# Patient Record
Sex: Male | Born: 1950 | Race: White | Hispanic: No | Smoking: Former smoker
Health system: Southern US, Community
[De-identification: ages and names within clinical notes are randomized; demographics above are authoritative.]

## PROBLEM LIST (undated history)

## (undated) DIAGNOSIS — K7460 Unspecified cirrhosis of liver: Secondary | ICD-10-CM

## (undated) DIAGNOSIS — M1990 Unspecified osteoarthritis, unspecified site: Secondary | ICD-10-CM

## (undated) DIAGNOSIS — B1920 Unspecified viral hepatitis C without hepatic coma: Secondary | ICD-10-CM

## (undated) HISTORY — PX: EYE SURGERY: SHX253

## (undated) HISTORY — DX: Unspecified viral hepatitis C without hepatic coma: B19.20

## (undated) HISTORY — PX: OTHER SURGICAL HISTORY: SHX169

## (undated) HISTORY — PX: TONSILLECTOMY: SUR1361

## (undated) HISTORY — DX: Unspecified osteoarthritis, unspecified site: M19.90

## (undated) HISTORY — DX: Unspecified cirrhosis of liver: K74.60

## (undated) HISTORY — PX: MANDIBLE FRACTURE SURGERY: SHX706

## (undated) HISTORY — PX: KNEE SURGERY: SHX244

---

## 2012-11-29 ENCOUNTER — Emergency Department (HOSPITAL_COMMUNITY)
Admission: EM | Admit: 2012-11-29 | Discharge: 2012-11-29 | Discharge status: Against Medical Advice | Payer: Self-pay | Attending: Emergency Medicine | Admitting: Emergency Medicine

## 2012-11-29 ENCOUNTER — Encounter (HOSPITAL_COMMUNITY): Payer: Self-pay | Admitting: *Deleted

## 2012-11-29 DIAGNOSIS — IMO0002 Reserved for concepts with insufficient information to code with codable children: Secondary | ICD-10-CM | POA: Insufficient documentation

## 2012-11-29 DIAGNOSIS — Y939 Activity, unspecified: Secondary | ICD-10-CM | POA: Insufficient documentation

## 2012-11-29 DIAGNOSIS — Y929 Unspecified place or not applicable: Secondary | ICD-10-CM | POA: Insufficient documentation

## 2012-11-29 DIAGNOSIS — X58XXXA Exposure to other specified factors, initial encounter: Secondary | ICD-10-CM | POA: Insufficient documentation

## 2012-11-29 DIAGNOSIS — Z87828 Personal history of other (healed) physical injury and trauma: Secondary | ICD-10-CM | POA: Insufficient documentation
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
## 2012-11-29 NOTE — ED Notes (Signed)
Pt arrives to [facility] in wheelchair. Pt has abrasion like injury to skin to LLE. Pt denies injury to area. Pt states it just started to look like that after he removed to bandage to his leg this morning. Pt states he put Hibiclens to his leg last night and covered it with a bandage.
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
## 2013-02-06 ENCOUNTER — Emergency Department (HOSPITAL_COMMUNITY)
Admission: EM | Admit: 2013-02-06 | Discharge: 2013-02-06 | Disposition: A | Discharge status: Home / Self Care | Payer: Medicaid Other | Attending: Emergency Medicine | Admitting: Emergency Medicine

## 2013-02-06 ENCOUNTER — Encounter (HOSPITAL_COMMUNITY): Payer: Self-pay

## 2013-02-06 DIAGNOSIS — Z79899 Other long term (current) drug therapy: Secondary | ICD-10-CM | POA: Insufficient documentation

## 2013-02-06 DIAGNOSIS — R21 Rash and other nonspecific skin eruption: Secondary | ICD-10-CM | POA: Insufficient documentation

## 2013-02-06 DIAGNOSIS — Z87891 Personal history of nicotine dependence: Secondary | ICD-10-CM | POA: Insufficient documentation

## 2013-02-06 MED ORDER — CEPHALEXIN 500 MG PO CAPS: 500.0000 mg | ORAL_CAPSULE | Freq: Four times a day (QID) | ORAL | Status: DC

## 2013-02-06 MED ORDER — SULFAMETHOXAZOLE-TRIMETHOPRIM 800-160 MG PO TABS: 1.0000 | ORAL_TABLET | Freq: Two times a day (BID) | ORAL | Status: DC

## 2013-02-06 NOTE — ED Notes (Signed)
Pt escorted to discharge window. Verbalized understanding discharge instructions. In no acute distress.   

## 2013-02-06 NOTE — ED Notes (Signed)
Pt c/o rash on lateral lower legs, chest and R hand.  Pain score 1/10.  Sts "it started on my legs and is now spreading."
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
## 2013-02-07 NOTE — ED Provider Notes (Signed)
Medical screening examination/treatment/procedure(s) were conducted as a shared visit with non-physician practitioner(s) and myself.  I personally evaluated the patient during the encounter  Patient here with rash. 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 No fevers, vomiting, other systemic symptoms. No gross erythema or cellulitis surrounding lesions, now here with weeping from on small area of redness and progression of rash. Rash with some small papules on anterior shins, no petechiae, no purpura, rash not concerning. Legs are hairless, shiny, concerning for possible PAD/PVD. This rash appears to be possible vasculitic rash. Without systemic symptoms, no need for workup at this time. Will place on antibiotics and encouraged PCP f/u.  Dagmar Hait, MD 02/07/13 [phone]

## 2013-02-15 ENCOUNTER — Ambulatory Visit: Payer: Self-pay | Attending: Internal Medicine | Admitting: Internal Medicine

## 2013-02-15 VITALS — BP 127/73 | HR 76 | Temp 98.0°F | Resp 16 | Ht 68.0 in | Wt 168.0 lb

## 2013-02-15 DIAGNOSIS — R21 Rash and other nonspecific skin eruption: Secondary | ICD-10-CM | POA: Insufficient documentation

## 2013-02-15 LAB — CBC WITH DIFFERENTIAL/PLATELET
Basophils Absolute: 0.1 10*3/uL (ref 0.0–0.1)
Eosinophils Relative: 4 % (ref 0–5)
Lymphocytes Relative: 24 % (ref 12–46)
Lymphs Abs: 1.2 10*3/uL (ref 0.7–4.0)
MCV: 89.8 fL (ref 78.0–100.0)
Neutro Abs: 3.2 10*3/uL (ref 1.7–7.7)
Platelets: 292 10*3/uL (ref 150–400)
RBC: 5.02 MIL/uL (ref 4.22–5.81)
RDW: 12.3 % (ref 11.5–15.5)
WBC: 5 10*3/uL (ref 4.0–10.5)

## 2013-02-15 MED ORDER — CEPHALEXIN 500 MG PO CAPS: 500.0000 mg | ORAL_CAPSULE | Freq: Four times a day (QID) | ORAL | Status: DC

## 2013-02-15 MED ORDER — TRIAMCINOLONE ACETONIDE 0.025 % EX OINT: TOPICAL_OINTMENT | Freq: Two times a day (BID) | CUTANEOUS | Status: DC

## 2013-02-15 MED ORDER — SULFAMETHOXAZOLE-TRIMETHOPRIM 800-160 MG PO TABS: 1.0000 | ORAL_TABLET | Freq: Two times a day (BID) | ORAL | Status: DC

## 2013-02-15 MED ORDER — TRIAMCINOLONE ACETONIDE 0.1 % EX CREA: TOPICAL_CREAM | Freq: Two times a day (BID) | CUTANEOUS | Status: DC
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
## 2013-02-15 NOTE — Progress Notes (Signed)
Pt is here to establish care. Pt reports having rashes on both legs below the knee. He noticed the rash in July. Now the rash is spreading up his right leg and face.

## 2013-02-16 LAB — BASIC METABOLIC PANEL
CO2: 28 mEq/L (ref 19–32)
Chloride: 101 mEq/L (ref 96–112)
Creat: 0.95 mg/dL (ref 0.50–1.35)
Potassium: 4.2 mEq/L (ref 3.5–5.3)
Sodium: 138 mEq/L (ref 135–145)

## 2013-03-10 ENCOUNTER — Ambulatory Visit: Payer: Medicaid Other | Attending: Internal Medicine | Admitting: Internal Medicine

## 2013-03-10 VITALS — BP 119/70 | HR 67 | Temp 98.1°F | Resp 16 | Ht 67.0 in | Wt 171.0 lb

## 2013-03-10 DIAGNOSIS — R21 Rash and other nonspecific skin eruption: Secondary | ICD-10-CM

## 2013-03-10 MED ORDER — TRIAMCINOLONE ACETONIDE 0.025 % EX OINT: TOPICAL_OINTMENT | Freq: Two times a day (BID) | CUTANEOUS | Status: DC

## 2013-03-10 MED ORDER — TERBINAFINE HCL 250 MG PO TABS: 250.0000 mg | ORAL_TABLET | Freq: Every day | ORAL | Status: DC

## 2013-03-10 MED ORDER — DOXYCYCLINE HYCLATE 100 MG PO TABS: 100.0000 mg | ORAL_TABLET | Freq: Two times a day (BID) | ORAL | Status: DC

## 2013-03-10 NOTE — Progress Notes (Unsigned)
Patient ID: Theodore Carlson, male   DOB: 06-Oct-1950, 62 y.o.   MRN: [id number] Patient Demographics  Theodore Carlson, is a 62 y.o. male  HQI:[id number]  XLK:[id number]  DOB - May 23, 1950  Chief Complaint  Patient presents with  . Follow-up        Subjective:   Theodore Carlson today is here for a follow up visit.  The patient is a 62 year old male with nonspecific rash on his legs, has been waiting for dermatology referral. He states that he was placed on Keflex and Bactrim and Kenalog cream, helped in the beginning but he was on the medication. Patient has No headache, No chest pain, No abdominal pain - No Nausea, No new weakness tingling or numbness, No Cough - SOB.   Objective:    Filed Vitals:   03/10/13 1148  BP: 119/70  Pulse: 67  Temp: 98.1 F (36.7 C)  TempSrc: Oral  Resp: 16  Height: 5\' 7"  (1.702 m)  Weight: 171 lb (77.565 kg)  SpO2: 97%     ALLERGIES:   Allergies  Allergen Reactions  . Adhesive [Tape] Rash    PAST MEDICAL HISTORY: History reviewed. No pertinent past medical history.  MEDICATIONS AT HOME: Prior to Admission medications   Medication Sig Start Date End Date Taking? Authorizing Provider  B Complex-C (B-COMPLEX WITH VITAMIN C) tablet Take 1 tablet by mouth daily.   Yes Historical Provider, MD  triamcinolone (KENALOG) 0.025 % ointment Apply topically 2 (two) times daily. 03/10/13  Yes Ripudeep Jenna Luo, MD  doxycycline (VIBRA-TABS) 100 MG tablet Take 1 tablet (100 mg total) by mouth 2 (two) times daily. 03/10/13   Ripudeep Jenna Luo, MD  terbinafine (LAMISIL) 250 MG tablet Take 1 tablet (250 mg total) by mouth daily. 03/10/13   Ripudeep Jenna Luo, MD     Exam  General appearance :Awake, alert, NAD, Speech Clear.  HEENT: Atraumatic and Normocephalic, PERLA Neck: supple, no JVD. No cervical lymphadenopathy.  Chest: Clear to auscultation bilaterally, no wheezing, rales or rhonchi CVS: S1 S2 regular, no murmurs.  Abdomen: soft, NBS, NT, ND, no  gaurding, rigidity or rebound. Extremities: no cyanosis or clubbing, B/L Lower Ext shows no edema Neurology: Awake alert, and oriented X 3, CN II-XII intact, Non focal Skin: Rash, non-purpuric, nonpalpable on bilateral legs, nontender Wounds:N/A    Data Review   Basic Metabolic Panel: No results found for this basename: NA, K, CL, CO2, GLUCOSE, BUN, CREATININE, CALCIUM, MG, PHOS,  in the last 168 hours Liver Function Tests: No results found for this basename: AST, ALT, ALKPHOS, BILITOT, PROT, ALBUMIN,  in the last 168 hours  CBC: No results found for this basename: WBC, NEUTROABS, HGB, HCT, MCV, PLT,  in the last 168 hours  ------------------------------------------------------------------------------------------------------------------ No results found for this basename: HGBA1C,  in the last 72 hours ------------------------------------------------------------------------------------------------------------------ No results found for this basename: CHOL, HDL, LDLCALC, TRIG, CHOLHDL, LDLDIRECT,  in the last 72 hours ------------------------------------------------------------------------------------------------------------------ No results found for this basename: TSH, T4TOTAL, FREET3, T3FREE, THYROIDAB,  in the last 72 hours ------------------------------------------------------------------------------------------------------------------ No results found for this basename: VITAMINB12, FOLATE, FERRITIN, TIBC, IRON, RETICCTPCT,  in the last 72 hours  Coagulation profile  No results found for this basename: INR, PROTIME,  in the last 168 hours    Assessment & Plan   Active Problems: Rash: Since July 2014, nonspecific, patient states that Keflex and Septra initially helped, now has fungal infection in the right second toe - Placed him on doxycycline 100 mg twice a day for 2  weeks, continue kenolog - Lamisil 250 mg daily for the foot - Ambulatory referral to dermatology sent,  urgent  Follow-up in 3 months     RAI,RIPUDEEP M.D. 03/10/2013, 12:27 PM

## 2013-03-10 NOTE — Progress Notes (Unsigned)
Pt is here to follow up on the rashes on his legs. He said that at fist the rashes started to go away with the medication but now the rashes have came back and they are painful and is causing him to be very uncomfortable.

## 2013-03-18 ENCOUNTER — Other Ambulatory Visit: Payer: Self-pay

## 2013-05-12 ENCOUNTER — Encounter: Payer: Self-pay | Admitting: Internal Medicine

## 2013-05-12 ENCOUNTER — Other Ambulatory Visit: Payer: Self-pay | Admitting: *Deleted

## 2013-05-12 ENCOUNTER — Ambulatory Visit: Payer: Self-pay | Attending: Internal Medicine | Admitting: Internal Medicine

## 2013-05-12 VITALS — BP 146/82 | HR 81 | Temp 98.7°F | Resp 14 | Ht 68.0 in | Wt 173.2 lb

## 2013-05-12 DIAGNOSIS — R21 Rash and other nonspecific skin eruption: Secondary | ICD-10-CM | POA: Insufficient documentation

## 2013-05-12 MED ORDER — CLOBETASOL PROPIONATE 0.05 % EX CREA: 1.0000 "application " | TOPICAL_CREAM | Freq: Two times a day (BID) | CUTANEOUS | Status: DC

## 2013-05-12 MED ORDER — CLOBETASOL PROPIONATE 0.05 % EX SOLN: 1.0000 "application " | Freq: Two times a day (BID) | CUTANEOUS | Status: DC

## 2013-05-12 NOTE — Progress Notes (Signed)
Pt is here for a f/u of a rash. Rash is on bilateral lower legs; itching, redness, spreading to bottom pip and Lt jaw line, knot on bottom lip. No OTC meds. meds prescribed did not work.
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
## 2013-05-12 NOTE — Addendum Note (Signed)
Addended by: Susie Cassette MD, Germain Osgood on: 05/12/2013 12:23 PM   Modules accepted: Orders, Medications

## 2013-08-10 ENCOUNTER — Telehealth: Payer: Self-pay | Admitting: Internal Medicine

## 2013-08-10 NOTE — Telephone Encounter (Signed)
Pt has come in today requesting another referral to dermatology that the [facility] covers; pt has been paying Out of pocket and is no longer able to afford the $85 co-pay; please f/u with pt about other available resources; [phone]

## 2013-09-10 NOTE — Telephone Encounter (Signed)
Arna Mediciora addressed this pt's referral

## 2013-11-08 ENCOUNTER — Ambulatory Visit: Payer: Medicaid Other | Attending: Internal Medicine | Admitting: Internal Medicine

## 2013-11-08 ENCOUNTER — Encounter: Payer: Self-pay | Admitting: Internal Medicine

## 2013-11-08 VITALS — BP 133/83 | HR 65 | Temp 97.6°F | Resp 16 | Ht 68.0 in | Wt 178.0 lb

## 2013-11-08 DIAGNOSIS — M79609 Pain in unspecified limb: Secondary | ICD-10-CM | POA: Diagnosis not present

## 2013-11-08 DIAGNOSIS — M79605 Pain in left leg: Secondary | ICD-10-CM

## 2013-11-08 DIAGNOSIS — R21 Rash and other nonspecific skin eruption: Secondary | ICD-10-CM | POA: Diagnosis not present

## 2013-11-08 DIAGNOSIS — Z79899 Other long term (current) drug therapy: Secondary | ICD-10-CM | POA: Diagnosis not present

## 2013-11-08 DIAGNOSIS — I10 Essential (primary) hypertension: Secondary | ICD-10-CM | POA: Insufficient documentation

## 2013-11-08 LAB — LIPID PANEL
Cholesterol: 144 mg/dL (ref 0–200)
HDL: 31 mg/dL — ABNORMAL LOW (ref >39)
LDL CALC: 67 mg/dL (ref 0–99)
TRIGLYCERIDES: 231 mg/dL — AB (ref <150)
Total CHOL/HDL Ratio: 4.6 Ratio
VLDL: 46 mg/dL — ABNORMAL HIGH (ref 0–40)

## 2013-11-08 LAB — COMPLETE METABOLIC PANEL WITH GFR
ALK PHOS: 61 U/L (ref 39–117)
ALT: 48 U/L (ref 0–53)
AST: 52 U/L — ABNORMAL HIGH (ref 0–37)
Albumin: 4.1 g/dL (ref 3.5–5.2)
BILIRUBIN TOTAL: 0.7 mg/dL (ref 0.2–1.2)
BUN: 8 mg/dL (ref 6–23)
CHLORIDE: 103 meq/L (ref 96–112)
CO2: 27 mEq/L (ref 19–32)
Calcium: 9.2 mg/dL (ref 8.4–10.5)
Creat: 0.63 mg/dL (ref 0.50–1.35)
Glucose, Bld: 90 mg/dL (ref 70–99)
POTASSIUM: 4 meq/L (ref 3.5–5.3)
Sodium: 138 mEq/L (ref 135–145)
TOTAL PROTEIN: 6.8 g/dL (ref 6.0–8.3)

## 2013-11-08 LAB — CBC WITH DIFFERENTIAL/PLATELET
BASOS ABS: 0.1 10*3/uL (ref 0.0–0.1)
Basophils Relative: 2 % — ABNORMAL HIGH (ref 0–1)
Eosinophils Absolute: 0.1 10*3/uL (ref 0.0–0.7)
Eosinophils Relative: 3 % (ref 0–5)
HCT: 44.1 % (ref 39.0–52.0)
Hemoglobin: 15.8 g/dL (ref 13.0–17.0)
LYMPHS PCT: 26 % (ref 12–46)
Lymphs Abs: 1.2 10*3/uL (ref 0.7–4.0)
MCH: 31.5 pg (ref 26.0–34.0)
MCHC: 35.8 g/dL (ref 30.0–36.0)
MCV: 87.8 fL (ref 78.0–100.0)
Monocytes Absolute: 0.5 10*3/uL (ref 0.1–1.0)
Monocytes Relative: 11 % (ref 3–12)
NEUTROS ABS: 2.8 10*3/uL (ref 1.7–7.7)
Neutrophils Relative %: 58 % (ref 43–77)
PLATELETS: 245 10*3/uL (ref 150–400)
RBC: 5.02 MIL/uL (ref 4.22–5.81)
RDW: 12.6 % (ref 11.5–15.5)
WBC: 4.8 10*3/uL (ref 4.0–10.5)

## 2013-11-08 LAB — POCT GLYCOSYLATED HEMOGLOBIN (HGB A1C): HEMOGLOBIN A1C: 5

## 2013-11-08 MED ORDER — MELOXICAM 15 MG PO TABS: 15.0000 mg | ORAL_TABLET | Freq: Every day | ORAL | Status: DC

## 2013-11-08 NOTE — Progress Notes (Signed)
Pt is here following up on his b/l knee and ankle pain. Pt has some lesions on his legs that causes pain and a burning sensation.

## 2013-11-08 NOTE — Progress Notes (Signed)
Patient ID: Excell SeltzerRobert Towner, male   DOB: Aug 27, 1950, 63 y.o.   MRN: [id number]   Excell SeltzerRobert Capaldi, is a 63 y.o. male  [id number]  [id number]  DOB - Aug 27, 1950  Chief Complaint  Patient presents with  . Follow-up        Subjective:   Excell SeltzerRobert Jagiello is a 63 y.o. male here today for a follow up visit. Pt is here to follow up on the rashes on his legs. He said that at first the rashes started to go away with the medication but now the rashes have came back and they are painful and is causing him to be very uncomfortable. He has been on several antibiotics including doxycycline, Kenalog cream, Keflex, Lamisil, and multiple vitamins but no sustained relief. The rashes start as maculopapular rash, itchy, blister and scab formation and healing with a scar. When they blister and ulcerate they become painful. He had gone to the dermatologist we will did not recommend any medication according to patient because of the time he went there was no active lesion to treat. Patient does not smoke cigarette he does not drink alcohol. Patient has No headache, No chest pain, No abdominal pain - No Nausea, No new weakness tingling or numbness, No Cough - SOB.  Problem  Left Leg Pain    ALLERGIES: Allergies  Allergen Reactions  . Adhesive [Tape] Rash    PAST MEDICAL HISTORY: History reviewed. No pertinent past medical history.  MEDICATIONS AT HOME: Prior to Admission medications   Medication Sig Start Date End Date Taking? Authorizing Provider  B Complex-C (B-COMPLEX WITH VITAMIN C) tablet Take 1 tablet by mouth daily.   Yes Historical Provider, MD  clobetasol (TEMOVATE) 0.05 % external solution Apply 1 application topically 2 (two) times daily. 05/12/13   Richarda OverlieNayana Abrol, MD  doxycycline (VIBRA-TABS) 100 MG tablet Take 1 tablet (100 mg total) by mouth 2 (two) times daily. 03/10/13   Ripudeep Jenna LuoK Rai, MD  meloxicam (MOBIC) 15 MG tablet Take 1 tablet (15 mg total) by mouth daily. 11/08/13   Jeanann Lewandowskylugbemiga  Ajia Chadderdon, MD  terbinafine (LAMISIL) 250 MG tablet Take 1 tablet (250 mg total) by mouth daily. 03/10/13   Ripudeep Jenna LuoK Rai, MD  triamcinolone (KENALOG) 0.025 % ointment Apply topically 2 (two) times daily. 03/10/13   Ripudeep Jenna LuoK Rai, MD     Objective:   Filed Vitals:   11/08/13 1140  BP: 133/83  Pulse: 65  Temp: 97.6 F (36.4 C)  TempSrc: Oral  Resp: 16  Height: 5\' 8"  (1.727 m)  Weight: 178 lb (80.74 kg)  SpO2: 96%    Exam General appearance : Awake, alert, not in any distress. Speech Clear. Not toxic looking HEENT: Atraumatic and Normocephalic, pupils equally reactive to light and accomodation Neck: supple, no JVD. No cervical lymphadenopathy.  Chest:Good air entry bilaterally, no added sounds  CVS: S1 S2 regular, no murmurs.  Abdomen: Bowel sounds present, Non tender and not distended with no gaurding, rigidity or rebound. Extremities: B/L Lower Ext shows no edema, both legs are warm to touch Neurology: Awake alert, and oriented X 3, CN II-XII intact, Non focal Skin: Multiple healed scars in both lower limbs with fresh ulcers  Data Review No results found for this basename: HGBA1C     Assessment & Plan   1. Left leg pain with multiple healed scars and new rashes, I suspect some venous or vascular insufficiency  - Lower Extremity Arterial Doppler Bilateral; Future - meloxicam (MOBIC) 15 MG tablet; Take 1 tablet (15  mg total) by mouth daily.  Dispense: 30 tablet; Refill: 0 - CBC with Differential - COMPLETE METABOLIC PANEL WITH GFR - POCT glycosylated hemoglobin (Hb A1C) - Lipid panel - TSH   Patient was counseled extensively about nutrition and exercise   Return in about 6 months (around 05/10/2014) for Follow up HTN, Follow up Pain and comorbidities.  The patient was given clear instructions to go to ER or return to medical center if symptoms don't improve, worsen or new problems develop. The patient verbalized understanding. The patient was told to call to get lab  results if they haven't heard anything in the next week.   This note has been created with Education officer, environmentalDragon speech recognition software and smart phrase technology. Any transcriptional errors are unintentional.    Jeanann LewandowskyJEGEDE, Zelma Mazariego, MD, MHA, FACP, FAAP [facility] [place], [place] [phone]   11/08/2013, 12:15 PM

## 2013-11-09 LAB — TSH: TSH: 1.653 u[IU]/mL (ref 0.350–4.500)

## 2013-11-10 ENCOUNTER — Telehealth: Payer: Self-pay | Admitting: *Deleted

## 2013-11-10 NOTE — Telephone Encounter (Signed)
Message copied by Raynelle CharyWINFREE, Shyenne Maggard R on Wed Nov 10, 2013 11:04 AM ------      Message from: Quentin AngstJEGEDE, OLUGBEMIGA E      Created: Tue Nov 09, 2013  9:01 AM       Please inform patient that his laboratory tests results are mostly within normal limit but his triglyceride (a form of cholesterol) is slightly high. We recommend regular physical exercise, low-cholesterol and low-fat diet. ------

## 2013-11-10 NOTE — Telephone Encounter (Signed)
Pt is aware of his lab results. 

## 2013-11-18 ENCOUNTER — Ambulatory Visit (HOSPITAL_COMMUNITY)
Admission: RE | Admit: 2013-11-18 | Discharge: 2013-11-18 | Disposition: A | Discharge status: Home / Self Care | Payer: Medicaid Other | Source: Ambulatory Visit | Attending: Internal Medicine | Admitting: Internal Medicine

## 2013-11-18 DIAGNOSIS — R21 Rash and other nonspecific skin eruption: Secondary | ICD-10-CM | POA: Diagnosis not present

## 2013-11-18 DIAGNOSIS — M79609 Pain in unspecified limb: Secondary | ICD-10-CM | POA: Diagnosis not present

## 2013-11-18 DIAGNOSIS — M79605 Pain in left leg: Secondary | ICD-10-CM

## 2013-11-18 NOTE — Progress Notes (Signed)
VASCULAR LAB PRELIMINARY  ARTERIAL  ABI completed:    RIGHT    LEFT    PRESSURE WAVEFORM  PRESSURE WAVEFORM  BRACHIAL 122 Tri BRACHIAL 122 Tri  DP   DP    AT 143 Tri AT 145 Tri  PT 145 Tri PT 122 Tri  PER   PER    GREAT TOE  NA GREAT TOE  NA    RIGHT LEFT  ABI 1.19 1.19     Farrel DemarkJill Eunice, RDMS, RVT  11/18/2013, 9:42 AM

## 2013-11-22 NOTE — Progress Notes (Signed)
Pt was in the office on Friday where I told him about the doppler results

## 2013-12-17 ENCOUNTER — Telehealth: Payer: Self-pay | Admitting: Internal Medicine

## 2013-12-17 NOTE — Telephone Encounter (Signed)
Pt needs pain medication refill Please f/u with Pt

## 2013-12-24 NOTE — Telephone Encounter (Signed)
Pt received his pain meds.

## 2013-12-29 ENCOUNTER — Emergency Department (HOSPITAL_COMMUNITY)
Admission: EM | Admit: 2013-12-29 | Discharge: 2013-12-29 | Disposition: A | Discharge status: Home / Self Care | Payer: Medicaid Other | Source: Home / Self Care | Attending: Emergency Medicine | Admitting: Emergency Medicine

## 2013-12-29 ENCOUNTER — Encounter (HOSPITAL_COMMUNITY): Payer: Self-pay | Admitting: Emergency Medicine

## 2013-12-29 DIAGNOSIS — R21 Rash and other nonspecific skin eruption: Secondary | ICD-10-CM

## 2013-12-29 LAB — RPR

## 2013-12-29 MED ORDER — PREDNISONE 20 MG PO TABS: ORAL_TABLET | ORAL | Status: DC

## 2013-12-29 MED ORDER — METHYLPREDNISOLONE ACETATE 40 MG/ML IJ SUSP
80.0000 mg | Freq: Once | INTRAMUSCULAR | Status: AC
  Administered 2013-12-29: 80 mg via INTRAMUSCULAR

## 2013-12-29 MED ORDER — METHYLPREDNISOLONE ACETATE 80 MG/ML IJ SUSP
INTRAMUSCULAR | Status: AC
  Filled 2013-12-29: qty 1

## 2013-12-29 MED ORDER — HYDROCORTISONE 1 % EX CREA: TOPICAL_CREAM | CUTANEOUS | Status: DC

## 2013-12-29 NOTE — ED Notes (Signed)
Patient c/o rash on both legs x 1 year the rash is spreading to his groin area and is very painful. Patient reports he is already being seen and treated for rash on his legs. Patient denies SOB. Patient is alert and oriented and in no acute distress.

## 2013-12-29 NOTE — Discharge Instructions (Signed)
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
## 2013-12-29 NOTE — ED Provider Notes (Signed)
Medical screening examination/treatment/procedure(s) were performed by non-physician practitioner and as supervising physician I was immediately available for consultation/collaboration.  Shakea Isip, M.D.  Lexton Hidalgo C Mesha Schamberger, MD 12/29/13 2233 

## 2013-12-31 LAB — HERPES SIMPLEX VIRUS CULTURE: CULTURE: NOT DETECTED

## 2014-01-10 ENCOUNTER — Telehealth: Payer: Self-pay | Admitting: Internal Medicine

## 2014-01-10 NOTE — ED Notes (Addendum)
C/o rash is worse in spite of compliance w treatment. Has visited dermatologist , and has been given rx cram for this, and has continued to worsen. Rash for 1 year. Vilinda Boehringer, PA, had telephone conversation w patient regarding his test results, and a plan for his care

## 2014-01-10 NOTE — ED Provider Notes (Signed)
Medical screening examination/treatment/procedure(s) were performed by non-physician practitioner and as supervising physician I was immediately available for consultation/collaboration.  Leslee Home, M.D.  Reuben Likes, MD 01/10/14 1440

## 2014-01-10 NOTE — Telephone Encounter (Signed)
Patient has come in today to see if he can get a call back about the results of his lab work from Urgent care done on 12/29/13; please f/u with patient about his results

## 2014-01-11 NOTE — Telephone Encounter (Signed)
Left message informing pt that test were neg. If he had questions then to call me back.

## 2014-01-14 ENCOUNTER — Ambulatory Visit: Payer: Medicaid Other | Attending: Family Medicine | Admitting: Family Medicine

## 2014-01-14 ENCOUNTER — Telehealth: Payer: Self-pay | Admitting: Internal Medicine

## 2014-01-14 VITALS — BP 113/70 | HR 87 | Temp 98.1°F | Resp 20 | Wt 169.0 lb

## 2014-01-14 DIAGNOSIS — R21 Rash and other nonspecific skin eruption: Secondary | ICD-10-CM

## 2014-01-14 MED ORDER — TERBINAFINE HCL 250 MG PO TABS: 250.0000 mg | ORAL_TABLET | Freq: Every day | ORAL | Status: DC

## 2014-01-14 NOTE — Telephone Encounter (Signed)
Patient has called in today to see if there are any available appointments; patient is experiencing a spreading of a rash into his genital area; patient is also requesting a medication refill for predniSONE (DELTASONE) 20 MG tablet; Please f/u with patient ASAP about what actions he should take

## 2014-01-14 NOTE — Patient Instructions (Addendum)
Mr. Theodore Carlson,  I am concerned about tinea cruis (jock itch) and would like to treat you.  Stop lidex lamisil 250 mg daily for 4 weeks F/u with derm and PCP   Dr. Armen Pickup

## 2014-01-14 NOTE — Progress Notes (Signed)
   Subjective:    Patient ID: Theodore Carlson, male    DOB: 14-May-1950, 63 y.o.   MRN: [id number] CC: walk-in, worsening R groin rash   HPI Rash R groin rash since 12/20/13, 12/27/13 patient went to ED, rash improved with oral prednisone, patient went to dermatology in [facility] (Dr. Lianne Moris) who performed skin cultures and prescribed lidex cream. Patient using cream BID, rash is now worsening, burning sensation, severe. Patient washing with dial and cold water 2-3 times daily.   Has regular night sweats.  Denies fever, chills.    Soc Hx: former smoker  Review of Systems As per HPI     Objective:   Physical Exam BP 113/70  Pulse 87  Temp(Src) 98.1 F (36.7 C)  Resp 20  Wt 169 lb (76.658 kg)  SpO2 96% Wt Readings from Last 3 Encounters:  01/14/14 169 lb (76.658 kg)  11/08/13 178 lb (80.74 kg)  05/12/13 173 lb 3.2 oz (78.563 kg)  General appearance: alert, cooperative and no distress Skin: R groin (thigh and R scrotum) erythematous rash, dry, flaky, darker red and slightly raised border.  No inguinal adenopathy. No penile discharge. No scrotal pain or tenderness        Assessment & Plan:

## 2014-01-14 NOTE — Assessment & Plan Note (Addendum)
A: rash in groin area. Partially responsive to steroids. Exam concerning for tinea cruris   P:  Stop lidex lamisil 250 mg daily for 4 weeks Patient to sign release of information form so we can receive records  F/u with derm and PCP

## 2014-03-24 ENCOUNTER — Telehealth: Payer: Self-pay | Admitting: Internal Medicine

## 2014-03-24 NOTE — Telephone Encounter (Signed)
Pt. Came into facility stating that the medication fluocinonide cream (LIDEX) 0.05 % is not working for him, pt states that he is having discomfort, and pt has no sensation. Please f/u with pt.

## 2014-03-25 NOTE — Telephone Encounter (Signed)
Expand All Collapse All   Pt. Came into facility stating that the medication fluocinonide cream (LIDEX) 0.05 % is not working for him, pt states that he is having discomfort, and pt has no sensation. Please f/u with pt.

## 2014-03-25 NOTE — Telephone Encounter (Signed)
Patient informed by front desk that he needs PCP f/u.

## 2014-05-09 ENCOUNTER — Ambulatory Visit: Payer: Medicaid Other | Admitting: Internal Medicine

## 2014-09-07 ENCOUNTER — Other Ambulatory Visit (HOSPITAL_COMMUNITY): Payer: Self-pay | Admitting: Nurse Practitioner

## 2014-09-07 DIAGNOSIS — B182 Chronic viral hepatitis C: Secondary | ICD-10-CM

## 2014-09-28 ENCOUNTER — Telehealth (HOSPITAL_COMMUNITY): Payer: Self-pay

## 2014-09-28 NOTE — Telephone Encounter (Signed)
Called pt to remind him of 1100am appt on 09/29/14... Pt agreed to not have anything to eat or drink 6hrs prior to exam. AW

## 2014-09-29 ENCOUNTER — Ambulatory Visit (HOSPITAL_COMMUNITY)
Admission: RE | Admit: 2014-09-29 | Discharge: 2014-09-29 | Disposition: A | Discharge status: Home / Self Care | Payer: Medicare Other | Source: Ambulatory Visit | Attending: Nurse Practitioner | Admitting: Nurse Practitioner

## 2014-09-29 DIAGNOSIS — K7460 Unspecified cirrhosis of liver: Secondary | ICD-10-CM | POA: Diagnosis not present

## 2014-09-29 DIAGNOSIS — B182 Chronic viral hepatitis C: Secondary | ICD-10-CM

## 2014-11-15 ENCOUNTER — Encounter: Payer: Self-pay | Admitting: Physician Assistant

## 2014-11-29 ENCOUNTER — Ambulatory Visit (INDEPENDENT_AMBULATORY_CARE_PROVIDER_SITE_OTHER): Payer: Medicare Other | Admitting: Physician Assistant

## 2014-11-29 ENCOUNTER — Other Ambulatory Visit (INDEPENDENT_AMBULATORY_CARE_PROVIDER_SITE_OTHER): Payer: Medicare Other

## 2014-11-29 ENCOUNTER — Encounter: Payer: Self-pay | Admitting: Physician Assistant

## 2014-11-29 VITALS — BP 122/64 | Ht 68.0 in | Wt 176.4 lb

## 2014-11-29 DIAGNOSIS — B1920 Unspecified viral hepatitis C without hepatic coma: Secondary | ICD-10-CM

## 2014-11-29 DIAGNOSIS — K7460 Unspecified cirrhosis of liver: Secondary | ICD-10-CM

## 2014-11-29 LAB — PROTIME-INR
INR: 1.3 ratio — ABNORMAL HIGH (ref 0.8–1.0)
PROTHROMBIN TIME: 14.6 s — AB (ref 9.6–13.1)

## 2014-11-29 LAB — CBC WITH DIFFERENTIAL/PLATELET
BASOS ABS: 0 10*3/uL (ref 0.0–0.1)
Basophils Relative: 0.6 % (ref 0.0–3.0)
EOS PCT: 3.6 % (ref 0.0–5.0)
Eosinophils Absolute: 0.2 10*3/uL (ref 0.0–0.7)
HCT: 44 % (ref 39.0–52.0)
Hemoglobin: 15.1 g/dL (ref 13.0–17.0)
LYMPHS ABS: 1.5 10*3/uL (ref 0.7–4.0)
Lymphocytes Relative: 25 % (ref 12.0–46.0)
MCHC: 34.4 g/dL (ref 30.0–36.0)
MCV: 91 fl (ref 78.0–100.0)
Monocytes Absolute: 0.7 10*3/uL (ref 0.1–1.0)
Monocytes Relative: 11.7 % (ref 3.0–12.0)
NEUTROS ABS: 3.6 10*3/uL (ref 1.4–7.7)
NEUTROS PCT: 59.1 % (ref 43.0–77.0)
PLATELETS: 281 10*3/uL (ref 150.0–400.0)
RBC: 4.84 Mil/uL (ref 4.22–5.81)
RDW: 12.6 % (ref 11.5–15.5)
WBC: 6 10*3/uL (ref 4.0–10.5)
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
## 2014-11-29 NOTE — Patient Instructions (Signed)
You have been scheduled for an endoscopy. Please follow written instructions given to you at your visit today. If you use inhalers (even only as needed), please bring them with you on the day of your procedure.  Your physician has requested that you go to the basement for lab work before leaving today.  
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
## 2014-11-30 ENCOUNTER — Telehealth: Payer: Self-pay | Admitting: Internal Medicine

## 2014-11-30 NOTE — Telephone Encounter (Signed)
Patient is [facility] Left message for patient to call back

## 2014-11-30 NOTE — Telephone Encounter (Signed)
Patient notified he is to arrive at 8:00 am on 12/20/14 and be NPO after 5:30 am and may have clear liquids until then.

## 2014-12-05 NOTE — Progress Notes (Signed)
Agree w/ Ms. Hvozdovic's note and mangement.  

## 2014-12-14 ENCOUNTER — Telehealth: Payer: Self-pay | Admitting: Nurse Practitioner

## 2014-12-14 ENCOUNTER — Encounter (HOSPITAL_COMMUNITY): Payer: Self-pay | Admitting: *Deleted

## 2014-12-14 NOTE — Progress Notes (Signed)
12-14-14 1415 Pt states using Taxi for transportation alone- no one to accompany. Note per Epic to Dr. Marvell Fuller office.

## 2014-12-14 NOTE — Telephone Encounter (Signed)
Ms. Maryan Char (RN from [facility]) called from Pre-surgical testing department in regards to obtaining medical records for this patient. They are requesting the LOV notes, any studies or progress notes to indicate history of pulmonary arterial Hypertension or reason for "Revatio" use. If you are able to release these medical records without the auth of release form signed by the patient, please fax to [phone]. Thank you Arna Medici.

## 2014-12-15 NOTE — Telephone Encounter (Signed)
Theodore Carlson I can't do it without patient authorization

## 2014-12-19 NOTE — Anesthesia Preprocedure Evaluation (Addendum)
Anesthesia Evaluation  Patient identified by MRN, date of birth, ID band Patient awake    Reviewed: Allergy & Precautions, NPO status , Patient's Chart, lab work & pertinent test results  History of Anesthesia Complications Negative for: history of anesthetic complications  Airway Mallampati: II  TM Distance: >3 FB Neck ROM: Full    Dental no notable dental hx. (+) Dental Advisory Given   Pulmonary former smoker,  breath sounds clear to auscultation  Pulmonary exam normal       Cardiovascular negative cardio ROS Normal cardiovascular examRhythm:Regular Rate:Normal  Does not have pulmonary HTN, uses sildenifil for erectile dysfunction   Neuro/Psych negative neurological ROS  negative psych ROS   GI/Hepatic negative GI ROS, (+) Hepatitis -, C  Endo/Other  negative endocrine ROS  Renal/GU negative Renal ROS  negative genitourinary   Musculoskeletal  (+) Arthritis -,   Abdominal   Peds negative pediatric ROS (+)  Hematology negative hematology ROS (+)   Anesthesia Other Findings   Reproductive/Obstetrics negative OB ROS                            Anesthesia Physical Anesthesia Plan  ASA: III  Anesthesia Plan: MAC   Post-op Pain Management:    Induction: Intravenous  Airway Management Planned: Nasal Cannula  Additional Equipment:   Intra-op Plan:   Post-operative Plan:   Informed Consent: I have reviewed the patients History and Physical, chart, labs and discussed the procedure including the risks, benefits and alternatives for the proposed anesthesia with the patient or authorized representative who has indicated his/her understanding and acceptance.   Dental advisory given  Plan Discussed with: CRNA  Anesthesia Plan Comments:         Anesthesia Quick Evaluation

## 2014-12-19 NOTE — Progress Notes (Addendum)
Several request were made to the [facility] and [facility] medical records for progress notes about the [facility] history of Theodore Carlson.  We never received any information from the [facility] and [facility].

## 2014-12-20 ENCOUNTER — Telehealth: Payer: Self-pay | Admitting: General Practice

## 2014-12-20 ENCOUNTER — Encounter (HOSPITAL_COMMUNITY): Payer: Self-pay | Admitting: Internal Medicine

## 2014-12-20 ENCOUNTER — Encounter (HOSPITAL_COMMUNITY)
Admission: RE | Disposition: A | Discharge status: Home / Self Care | Payer: Self-pay | Source: Ambulatory Visit | Attending: Internal Medicine

## 2014-12-20 ENCOUNTER — Ambulatory Visit (HOSPITAL_COMMUNITY): Payer: Medicare Other | Admitting: Anesthesiology

## 2014-12-20 ENCOUNTER — Ambulatory Visit (HOSPITAL_COMMUNITY)
Admission: RE | Admit: 2014-12-20 | Discharge: 2014-12-20 | Disposition: A | Discharge status: Home / Self Care | Payer: Medicare Other | Source: Ambulatory Visit | Attending: Internal Medicine | Admitting: Internal Medicine

## 2014-12-20 DIAGNOSIS — Z87891 Personal history of nicotine dependence: Secondary | ICD-10-CM | POA: Diagnosis not present

## 2014-12-20 DIAGNOSIS — M1990 Unspecified osteoarthritis, unspecified site: Secondary | ICD-10-CM | POA: Diagnosis not present

## 2014-12-20 DIAGNOSIS — B182 Chronic viral hepatitis C: Secondary | ICD-10-CM | POA: Insufficient documentation

## 2014-12-20 DIAGNOSIS — Z79899 Other long term (current) drug therapy: Secondary | ICD-10-CM | POA: Diagnosis not present

## 2014-12-20 DIAGNOSIS — K449 Diaphragmatic hernia without obstruction or gangrene: Secondary | ICD-10-CM | POA: Insufficient documentation

## 2014-12-20 DIAGNOSIS — B1920 Unspecified viral hepatitis C without hepatic coma: Secondary | ICD-10-CM | POA: Diagnosis not present

## 2014-12-20 DIAGNOSIS — K7460 Unspecified cirrhosis of liver: Secondary | ICD-10-CM | POA: Insufficient documentation

## 2014-12-20 DIAGNOSIS — Z7952 Long term (current) use of systemic steroids: Secondary | ICD-10-CM | POA: Insufficient documentation

## 2014-12-20 HISTORY — PX: ESOPHAGOGASTRODUODENOSCOPY (EGD) WITH PROPOFOL: SHX5813

## 2014-12-20 HISTORY — PX: ESOPHAGEAL BANDING: SHX5518

## 2014-12-20 SURGERY — ESOPHAGOGASTRODUODENOSCOPY (EGD) WITH PROPOFOL
Anesthesia: Monitor Anesthesia Care

## 2014-12-20 MED ORDER — LACTATED RINGERS IV SOLN: INTRAVENOUS | Status: DC

## 2014-12-20 MED ORDER — LACTATED RINGERS IV SOLN
INTRAVENOUS | Status: DC | PRN
  Administered 2014-12-20: 09:00:00 via INTRAVENOUS

## 2014-12-20 MED ORDER — PROPOFOL 10 MG/ML IV BOLUS
INTRAVENOUS | Status: AC
  Filled 2014-12-20: qty 20

## 2014-12-20 MED ORDER — SODIUM CHLORIDE 0.9 % IV SOLN: INTRAVENOUS | Status: DC

## 2014-12-20 MED ORDER — MINOCYCLINE HCL 100 MG PO CAPS: 100.0000 mg | ORAL_CAPSULE | Freq: Two times a day (BID) | ORAL | Status: AC

## 2014-12-20 MED ORDER — PROPOFOL INFUSION 10 MG/ML OPTIME
INTRAVENOUS | Status: DC | PRN
  Administered 2014-12-20: 300 ug/kg/min via INTRAVENOUS

## 2014-12-20 SURGICAL SUPPLY — 14 items
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
## 2014-12-20 NOTE — Telephone Encounter (Signed)
Fatima Sanger, RN from [facility] Pre-surgical testing department left VM on medical record line on 12/16/14.. The VM did not include pt's DOB. I returned call same day but was unable to reach Nurse. Left a VM for Nurse, requesting this information. I checked my VM today, 12/20/14 and Tonie had left another VM providing pt's DOB.Marland Kitchen After looking the patient up in EPIC, I found past OV notes for this pt... However, I am unable to release any information without a signed authorization. Nurse is stationed at [employer].. The requested information should be available in the system if done threw [facility]. Also, just as a FYI..I can only release medical records from this Clinic. I apologize for any inconvenience. Thanks.

## 2014-12-20 NOTE — Op Note (Signed)
[facility] [address] [place], [address]   ENDOSCOPY PROCEDURE REPORT  PATIENT: Theodore, Carlson  MR#: [id number] BIRTHDATE: 1950/10/24 , 64  yrs. old GENDER: male ENDOSCOPIST: Iva Boop, MD, [facility] PROCEDURE DATE:  12/20/2014 PROCEDURE:  EGD, screening and EGD, diagnostic ASA CLASS:     Class III INDICATIONS:  screen for varices in cirrhosis. MEDICATIONS: Monitored anesthesia care and Per Anesthesia TOPICAL ANESTHETIC: none  DESCRIPTION OF PROCEDURE: After the risks benefits and alternatives of the procedure were thoroughly explained, informed consent was obtained.  The    endoscope was introduced through the mouth and advanced to the second portion of the duodenum , Without limitations.  The instrument was slowly withdrawn as the mucosa was fully examined.    Small hiatal hernia otherwise normal EGD.  No varices or portal gastropathy.  Retroflexed views revealed no abnormalities.     The scope was then withdrawn from the patient and the procedure completed.  COMPLICATIONS: There were no immediate complications.  ENDOSCOPIC IMPRESSION: Small hiatal hernia otherwise normal EGD.  No varices or portal gastropathy  RECOMMENDATIONS: Repeat routine EGD in 3 years - looks like he is Child's A, sooner if decompensates He may call and schedule screening colonoscopy with me at his convenience  eSigned:  Iva Boop, MD, [facility] 12/20/2014 10:08 AM    CC: Annamarie Major, NP and The Patient

## 2014-12-20 NOTE — Transfer of Care (Signed)
Immediate Anesthesia Transfer of Care Note  Patient: Theodore Carlson  Procedure(s) Performed: Procedure(s): ESOPHAGOGASTRODUODENOSCOPY (EGD) WITH PROPOFOL (N/A) ESOPHAGEAL BANDING (N/A)  Patient Location: PACU and Endoscopy Unit  Anesthesia Type:MAC  Level of Consciousness: sedated and patient cooperative  Airway & Oxygen Therapy: Patient Spontanous Breathing and Patient connected to nasal cannula oxygen  Post-op Assessment: Report given to RN and Post -op Vital signs reviewed and stable  Post vital signs: Reviewed and stable  Last Vitals:  Filed Vitals:   12/20/14 0927  BP: 124/65  Pulse: 64  Temp: 36.6 C  Resp: 12    Complications: No apparent anesthesia complications

## 2014-12-20 NOTE — Interval H&P Note (Signed)
History and Physical Interval Note:  12/20/2014 9:31 AM  Theodore Carlson  has presented today for surgery, with the diagnosis of Hepatitis C Cirrhosis  The various methods of treatment have been discussed with the patient and family. After consideration of risks, benefits and other options for treatment, the patient has consented to  Procedure(s): ESOPHAGOGASTRODUODENOSCOPY (EGD) WITH PROPOFOL (N/A) ESOPHAGEAL BANDING (N/A) as a surgical intervention .  The patient's history has been reviewed, patient examined, no change in status, stable for surgery.  I have reviewed the patient's chart and labs.  Questions were answered to the patient's satisfaction.     Stan Head

## 2014-12-20 NOTE — Anesthesia Postprocedure Evaluation (Signed)
  Anesthesia Post-op Note  Patient: Theodore Carlson  Procedure(s) Performed: Procedure(s) (LRB): ESOPHAGOGASTRODUODENOSCOPY (EGD) WITH PROPOFOL (N/A) ESOPHAGEAL BANDING (N/A)  Patient Location: PACU  Anesthesia Type: MAC  Level of Consciousness: awake and alert   Airway and Oxygen Therapy: Patient Spontanous Breathing  Post-op Pain: mild  Post-op Assessment: Post-op Vital signs reviewed, Patient's Cardiovascular Status Stable, Respiratory Function Stable, Patent Airway and No signs of Nausea or vomiting  Last Vitals:  Filed Vitals:   12/20/14 1000  BP: 114/76  Pulse: 72  Temp:   Resp: 15    Post-op Vital Signs: stable   Complications: No apparent anesthesia complications

## 2014-12-20 NOTE — Discharge Instructions (Addendum)
°  You have a small hiatal hernia but varices or other problems from the cirrhosis - this is good news. I recommend a screening colonoscopy as we discussed and you can call my office to make an appointment for this at your convenience.  Plan for a routine exam of the esophagus and stomach again in 3 years, sooner if there are liver problems beyond now or bleeding.  I appreciate the opportunity to care for you. Iva Boop, MD, FACG  YOU HAD AN ENDOSCOPIC PROCEDURE TODAY: Refer to the procedure report and other information in the discharge instructions given to you for any specific questions about what was found during the examination. If this information does not answer your questions, please call Dr. Marvell Fuller office at [phone] to clarify.   YOU SHOULD EXPECT: Some feelings of bloating in the abdomen. Passage of more gas than usual. Walking can help get rid of the air that was put into your GI tract during the procedure and reduce the bloating. If you had a lower endoscopy (such as a colonoscopy or flexible sigmoidoscopy) you may notice spotting of blood in your stool or on the toilet paper. Some abdominal soreness may be present for a day or two, also.  DIET: Your first meal following the procedure should be a light meal and then it is ok to progress to your normal diet. A half-sandwich or bowl of soup is an example of a good first meal. Heavy or fried foods are harder to digest and may make you feel nauseous or bloated. Drink plenty of fluids but you should avoid alcoholic beverages for 24 hours.   ACTIVITY: Your care partner should take you home directly after the procedure. You should plan to take it easy, moving slowly for the rest of the day. You can resume normal activity the day after the procedure however YOU SHOULD NOT DRIVE, use power tools, machinery or perform tasks that involve climbing or major physical exertion for 24 hours (because of the sedation medicines used during  the test).   SYMPTOMS TO REPORT IMMEDIATELY: A gastroenterologist can be reached at any hour. Please call [phone]  for any of the following symptoms:   Following upper endoscopy (EGD, EUS, ERCP, esophageal dilation) Vomiting of blood or coffee ground material  New, significant abdominal pain  New, significant chest pain or pain under the shoulder blades  Painful or persistently difficult swallowing  New shortness of breath  Black, tarry-looking or red, bloody stools  FOLLOW UP:  If any biopsies were taken you will be contacted by phone or by letter within the next 1-3 weeks. Call [phone]  if you have not heard about the biopsies in 3 weeks.  Please also call with any specific questions about appointments or follow up tests.

## 2014-12-22 ENCOUNTER — Encounter (HOSPITAL_COMMUNITY): Payer: Self-pay | Admitting: Internal Medicine

## 2015-02-15 ENCOUNTER — Other Ambulatory Visit: Payer: Self-pay | Admitting: Nurse Practitioner

## 2015-02-15 DIAGNOSIS — K7460 Unspecified cirrhosis of liver: Secondary | ICD-10-CM

## 2015-02-27 ENCOUNTER — Ambulatory Visit
Admission: RE | Admit: 2015-02-27 | Discharge: 2015-02-27 | Disposition: A | Discharge status: Home / Self Care | Payer: Medicare Other | Source: Ambulatory Visit | Attending: Nurse Practitioner | Admitting: Nurse Practitioner

## 2015-02-27 DIAGNOSIS — K7460 Unspecified cirrhosis of liver: Secondary | ICD-10-CM

## 2015-08-07 ENCOUNTER — Other Ambulatory Visit: Payer: Self-pay | Admitting: Nurse Practitioner

## 2015-08-07 DIAGNOSIS — B182 Chronic viral hepatitis C: Secondary | ICD-10-CM

## 2015-08-07 DIAGNOSIS — K7400 Hepatic fibrosis, unspecified: Secondary | ICD-10-CM

## 2016-01-11 ENCOUNTER — Other Ambulatory Visit: Payer: Medicare Other

## 2016-02-07 ENCOUNTER — Other Ambulatory Visit: Payer: Self-pay | Admitting: Nurse Practitioner

## 2016-02-07 DIAGNOSIS — K7469 Other cirrhosis of liver: Secondary | ICD-10-CM

## 2016-02-23 ENCOUNTER — Ambulatory Visit
Admission: RE | Admit: 2016-02-23 | Discharge: 2016-02-23 | Disposition: A | Discharge status: Home / Self Care | Payer: Medicare Other | Source: Ambulatory Visit | Attending: Nurse Practitioner | Admitting: Nurse Practitioner

## 2016-02-23 DIAGNOSIS — K7469 Other cirrhosis of liver: Secondary | ICD-10-CM

## 2016-05-24 ENCOUNTER — Other Ambulatory Visit: Payer: Self-pay | Admitting: Specialist

## 2016-05-24 DIAGNOSIS — B1920 Unspecified viral hepatitis C without hepatic coma: Secondary | ICD-10-CM

## 2016-05-24 DIAGNOSIS — R1011 Right upper quadrant pain: Secondary | ICD-10-CM

## 2016-05-31 ENCOUNTER — Ambulatory Visit: Payer: Medicare Other

## 2016-06-06 ENCOUNTER — Ambulatory Visit
Admission: RE | Admit: 2016-06-06 | Discharge: 2016-06-06 | Disposition: A | Discharge status: Home / Self Care | Payer: Medicare Other | Source: Ambulatory Visit | Attending: Specialist | Admitting: Specialist

## 2016-06-06 DIAGNOSIS — K838 Other specified diseases of biliary tract: Secondary | ICD-10-CM | POA: Diagnosis not present

## 2016-06-06 DIAGNOSIS — Z8619 Personal history of other infectious and parasitic diseases: Secondary | ICD-10-CM | POA: Insufficient documentation

## 2016-06-06 DIAGNOSIS — B1920 Unspecified viral hepatitis C without hepatic coma: Secondary | ICD-10-CM

## 2016-06-06 DIAGNOSIS — R1011 Right upper quadrant pain: Secondary | ICD-10-CM | POA: Insufficient documentation

## 2016-06-06 DIAGNOSIS — K760 Fatty (change of) liver, not elsewhere classified: Secondary | ICD-10-CM | POA: Diagnosis not present

## 2016-12-10 ENCOUNTER — Encounter: Payer: Medicare Other | Attending: Psychology | Admitting: Psychology

## 2016-12-10 DIAGNOSIS — G44309 Post-traumatic headache, unspecified, not intractable: Secondary | ICD-10-CM | POA: Diagnosis not present

## 2016-12-10 DIAGNOSIS — F0781 Postconcussional syndrome: Secondary | ICD-10-CM | POA: Diagnosis present

## 2016-12-29 ENCOUNTER — Encounter: Payer: Self-pay | Admitting: Psychology
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
## 2017-01-14 ENCOUNTER — Encounter: Payer: Medicare Other | Attending: Psychology | Admitting: Psychology

## 2017-01-14 ENCOUNTER — Encounter: Payer: Self-pay | Admitting: Psychology

## 2017-01-14 DIAGNOSIS — G44309 Post-traumatic headache, unspecified, not intractable: Secondary | ICD-10-CM

## 2017-01-14 DIAGNOSIS — F0781 Postconcussional syndrome: Secondary | ICD-10-CM | POA: Diagnosis not present
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
## 2017-01-22 ENCOUNTER — Other Ambulatory Visit: Payer: Self-pay | Admitting: Nurse Practitioner

## 2017-01-22 DIAGNOSIS — K7400 Hepatic fibrosis, unspecified: Secondary | ICD-10-CM

## 2017-02-04 ENCOUNTER — Telehealth: Payer: Self-pay | Admitting: Psychology

## 2017-02-04 NOTE — Telephone Encounter (Signed)
Patient called office and states [facility] never received by fax for atty or hard copy himself.  Asked JR to reprint.  Mailed hardcopy to patient at his request, to attorney at PO box on ROI and left message for atty to confirm fax number initially attempted [phone]- will refax with confirmation  Scanned ROI to documents

## 2017-03-05 IMAGING — US US ABDOMEN COMPLETE W/ ELASTOGRAPHY
1 series · 13 of 25 positions shown · non-contrast
Comparison: None.

CLINICAL DATA: In hepatitis-C for 1 month. Stage IV cirrhosis.
Blood transfusions 40 years ago.



[Series 1: us abdomen complete w/ elastography · 0.17mm/px · 13 of 89 slices shown]
[im 1/89]
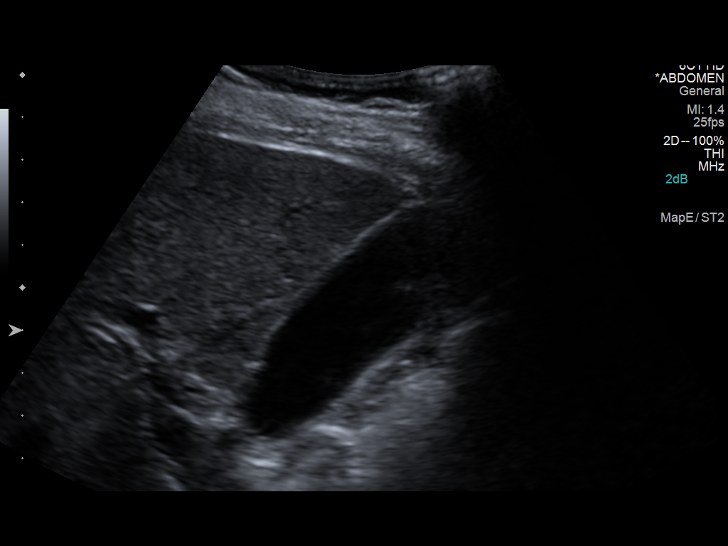
[im 8/89]
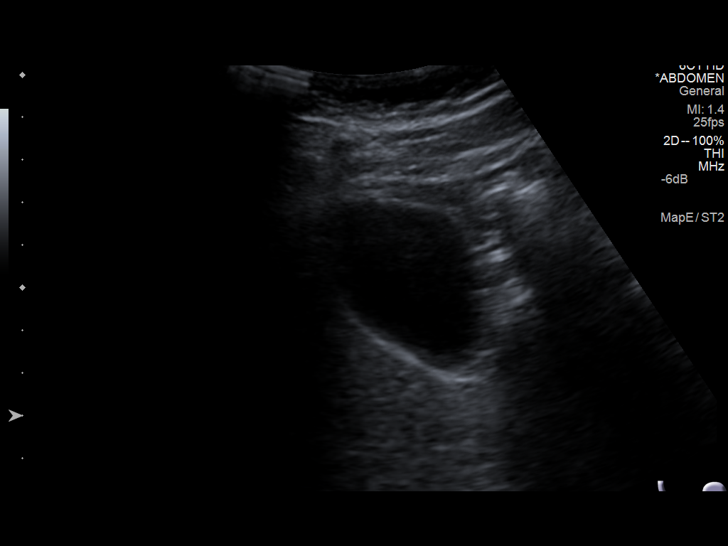
[im 15/89]
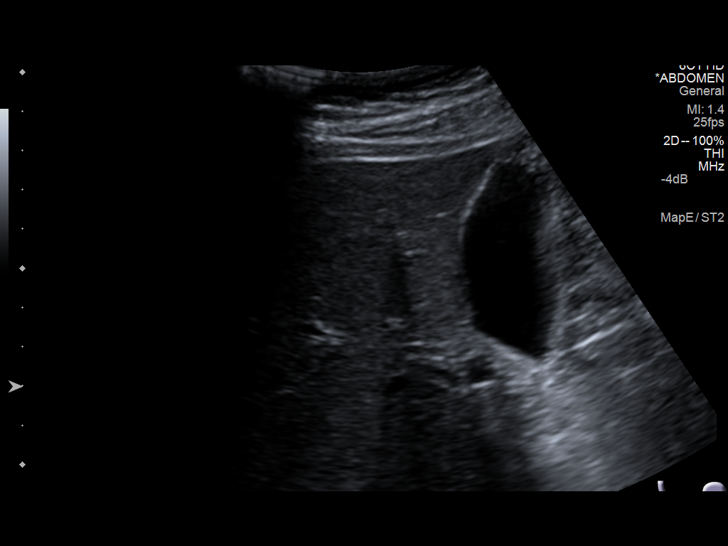
[im 23/89]
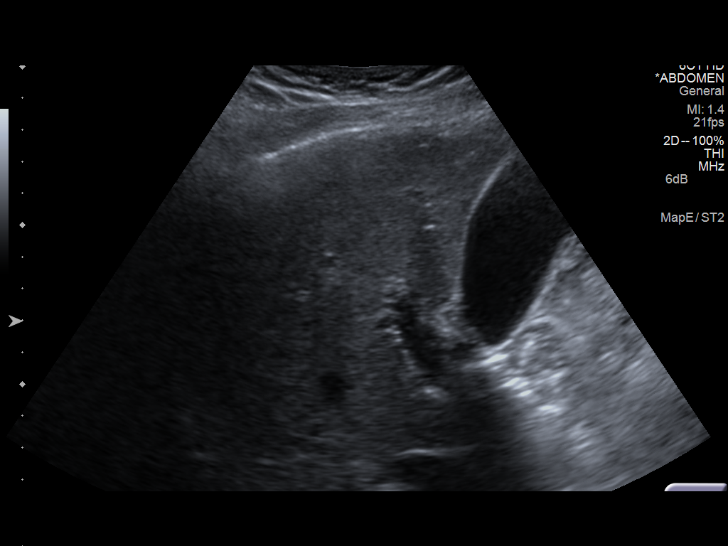
[im 30/89]
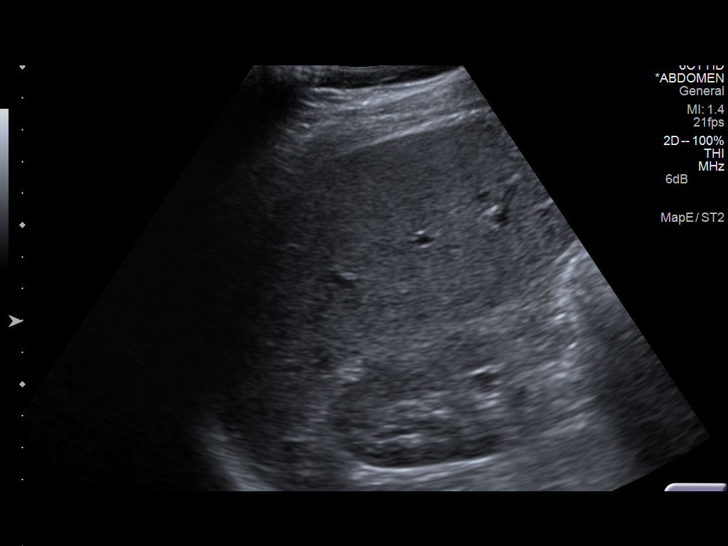
[im 37/89]
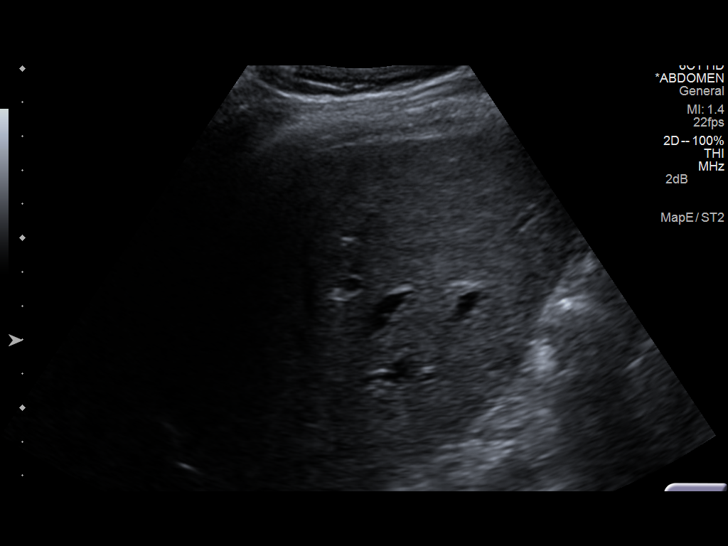
[im 45/89]
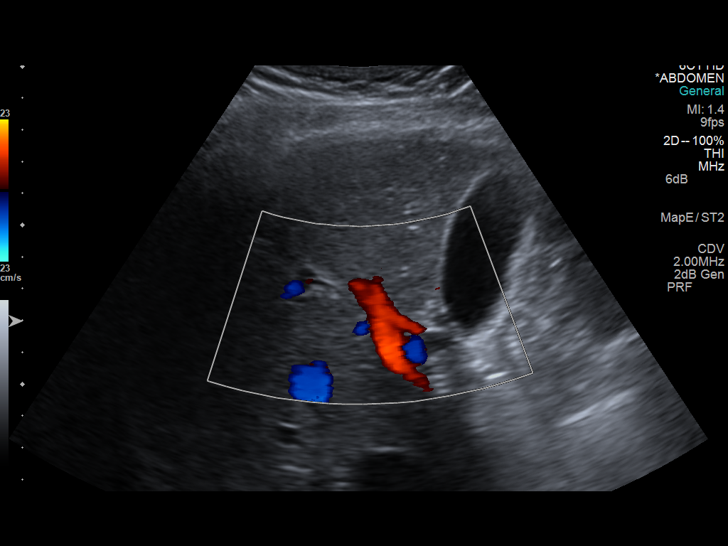
[im 52/89]
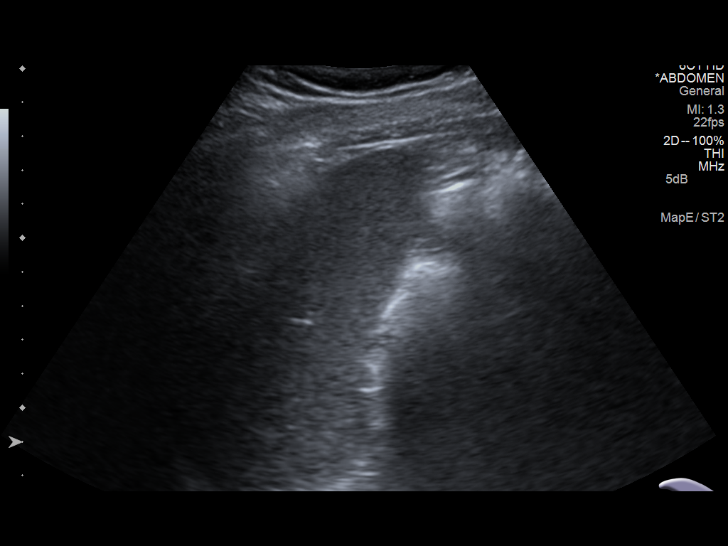
[im 59/89]
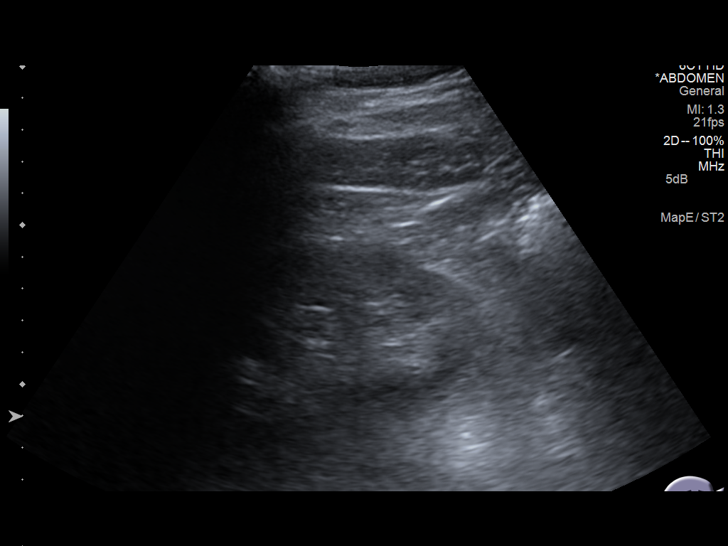
[im 67/89]
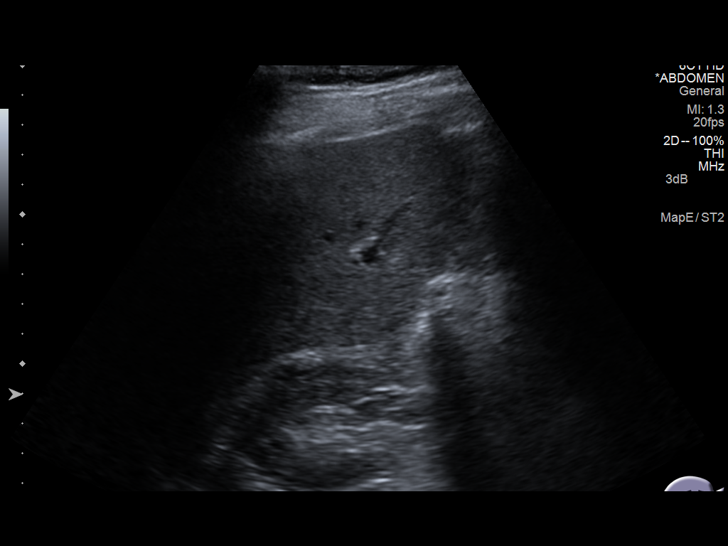
[im 74/89]
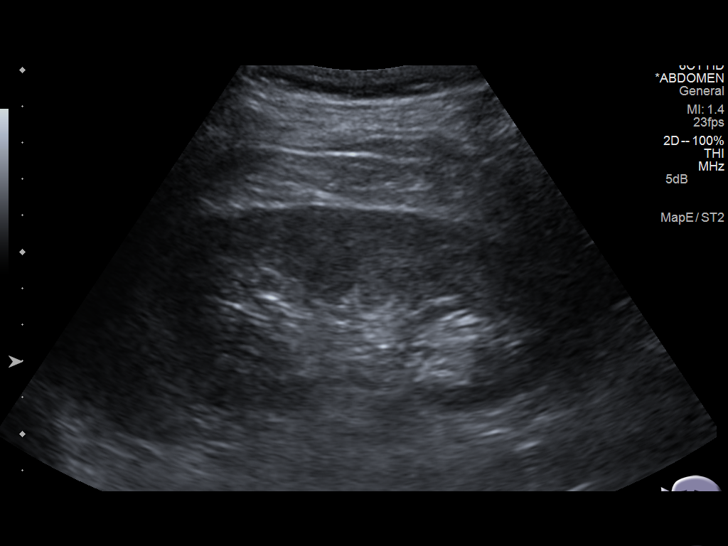
[im 81/89]
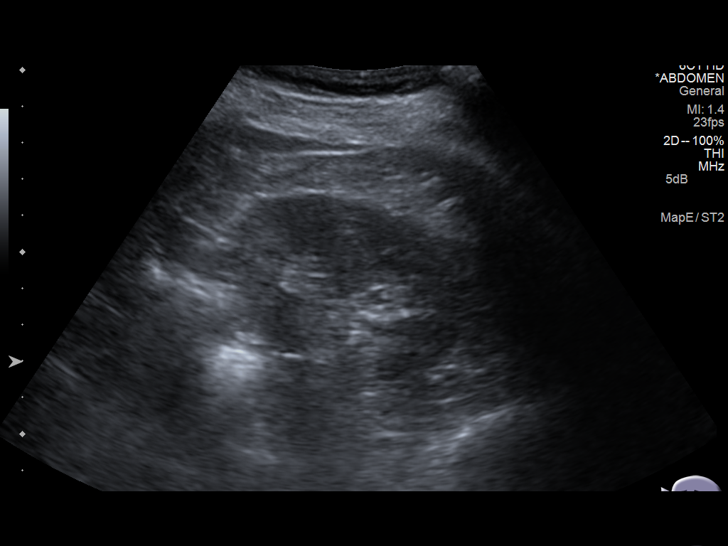
[im 89/89]
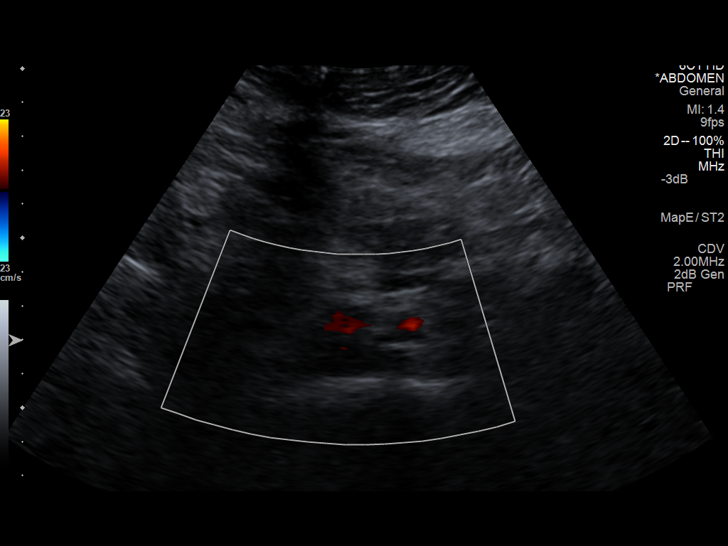

[13 of 25 positions shown; findings below may reference images not displayed]

FINDINGS: ULTRASOUND ABDOMEN

Gallbladder: Gallbladder has a normal appearance. Gallbladder wall
is 2.0 mm, within normal limits. No stones or pericholecystic fluid.
No sonographic Murphy's sign.

Common bile duct: Diameter: 3.0 mm

Liver: Coarse echotexture without focal mass.

IVC: No abnormality visualized.

Pancreas: Pancreas is not well seen because of bowel gas.

Spleen: Size and appearance within normal limits.

Right Kidney: Length: 10.7 cm. Echogenicity within normal limits. No
mass or hydronephrosis visualized.

Left Kidney: Length: 11.3 cm. Echogenicity within normal limits. No
mass or hydronephrosis visualized.

Abdominal aorta: The abdominal aorta is not well evaluated because
of overlying bowel gas.

Other findings: None.

ULTRASOUND HEPATIC ELASTOGRAPHY

Device: Siemens Helix VTQ

Transducer 6 C1

Patient position: Left lateral decubitus

Number of measurements:  10

Hepatic Segment:  8

Median velocity:   1.56  m/sec

IQR:

IQR/Median velocity ratio

Corresponding Metavir fibrosis score:  F2 and some F3

Risk of fibrosis: Moderate

Limitations of exam: Patient has difficulty suspending respiration.

Pertinent findings noted on other imaging exams:  None

Please note that abnormal shear wave velocities may also be
identified in clinical settings other than with hepatic fibrosis,
such as: acute hepatitis, elevated right heart and central venous
pressures including use of beta blockers, Wren disease
(Lu), infiltrative processes such as
mastocytosis/amyloidosis/infiltrative tumor, extrahepatic
cholestasis, in the post-prandial state, and liver transplantation.
Correlation with patient history, laboratory data, and clinical
condition recommended.
IMPRESSION: 1. Mildly coarse echotexture of the liver. No focal liver lesions
are identified.
2. Median hepatic shear wave velocity is calculated at 1.56 m/sec.
3. Corresponding Metavir fibrosis score is F2 and some F3.
4. Risk of fibrosis is moderate.
5. Follow-up:  Additional testing is appropriate.

## 2017-04-08 IMAGING — US US ABDOMEN COMPLETE W/ ELASTOGRAPHY
1 series · 13 of 25 positions shown · non-contrast
Comparison: Right upper quadrant ultrasound 02/23/2016.

CLINICAL DATA: Patient with history of hepatitis-C. Right upper
quadrant abdominal pain.



[Series 1: us abdomen complete w/ elastography · 0.23mm/px · 13 of 259 slices shown]
[im 1/259]
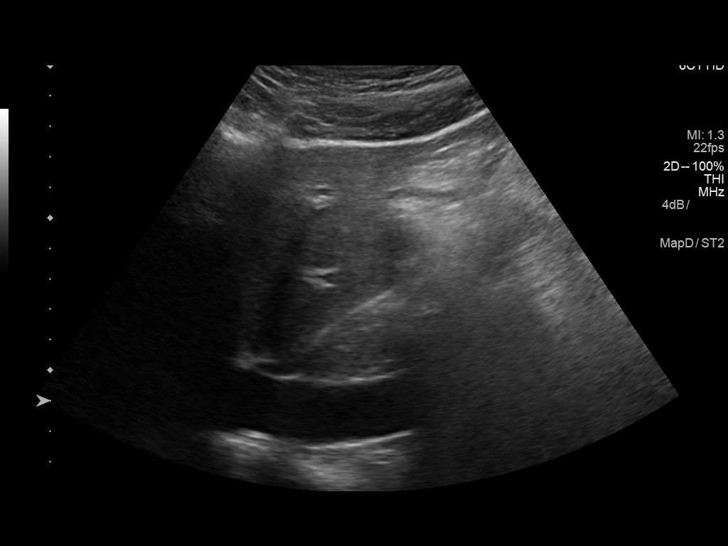
[im 22/259]
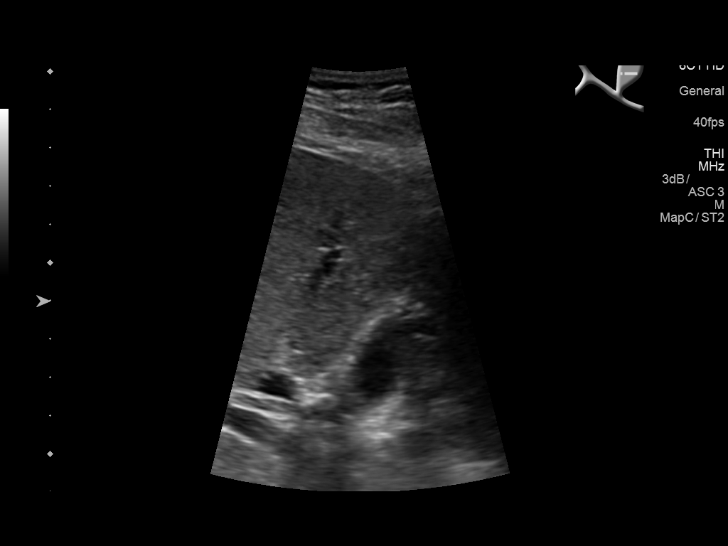
[im 44/259]
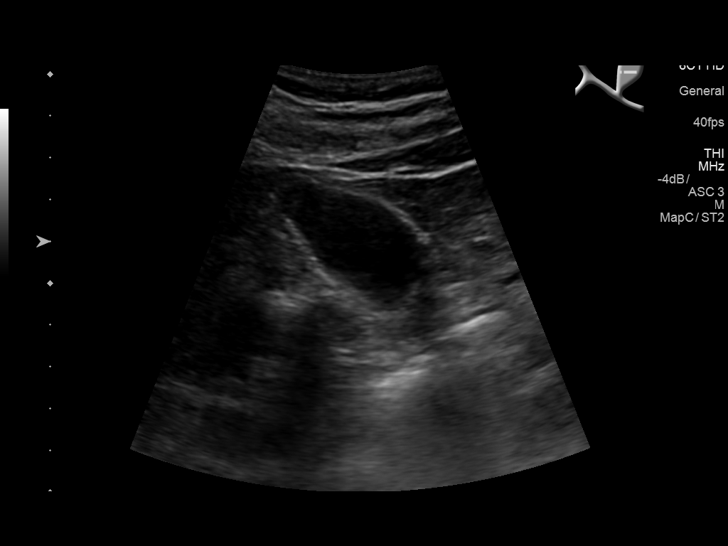
[im 65/259]
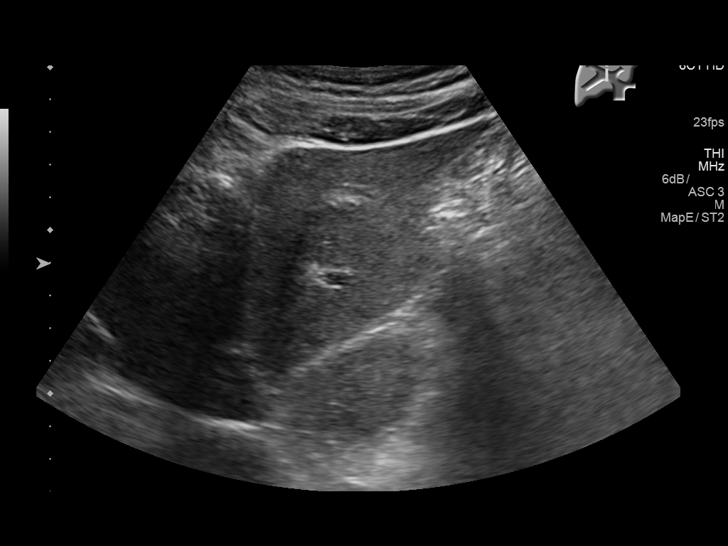
[im 87/259]
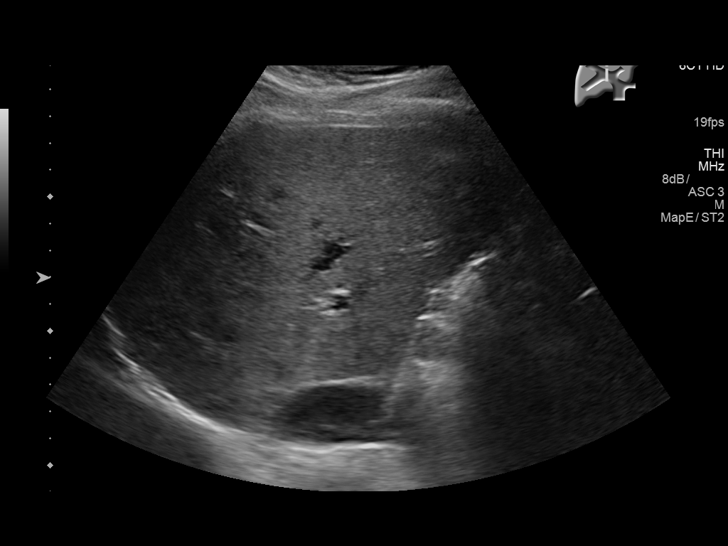
[im 108/259]
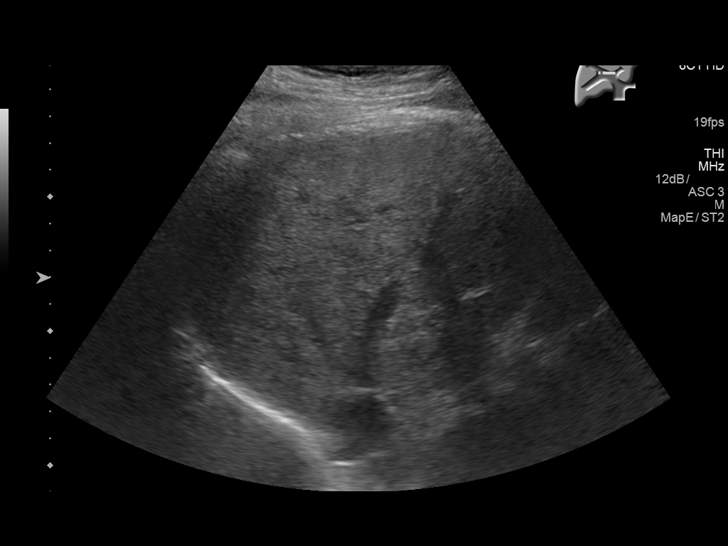
[im 130/259]
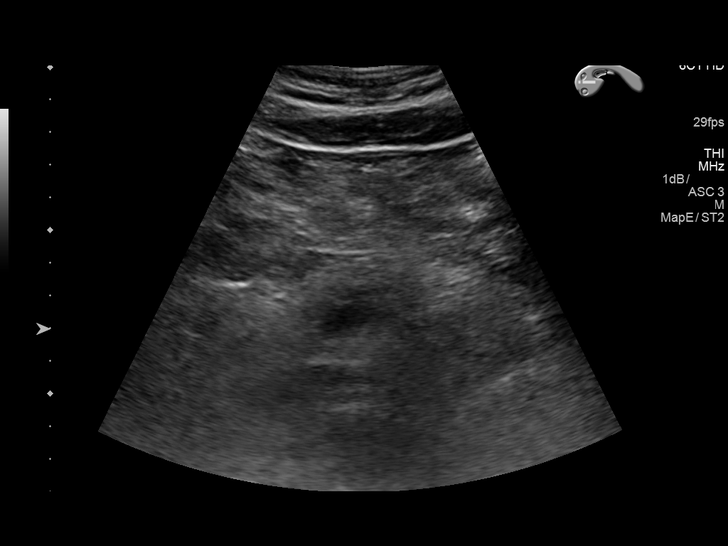
[im 151/259]
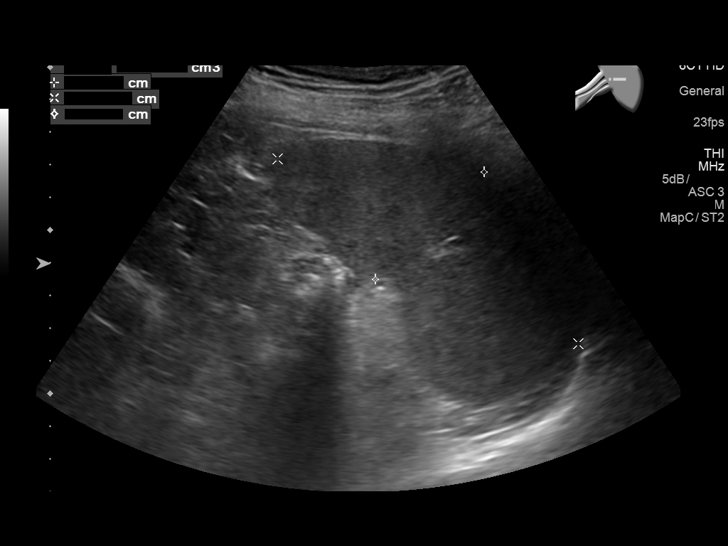
[im 173/259]
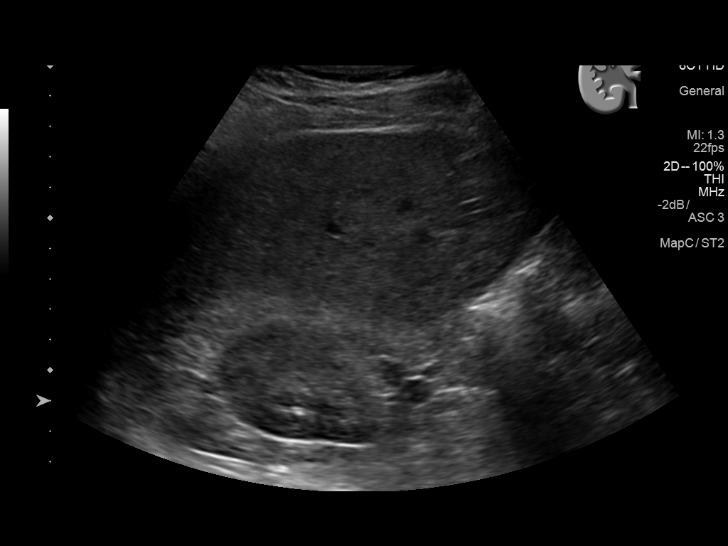
[im 194/259]
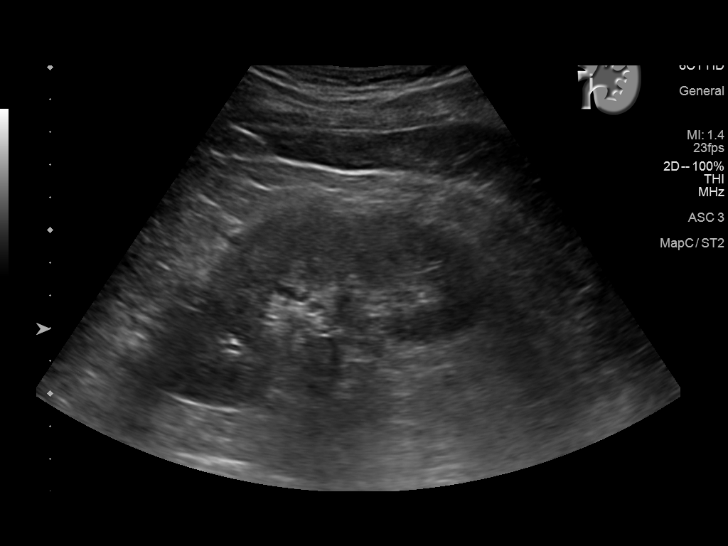
[im 216/259]
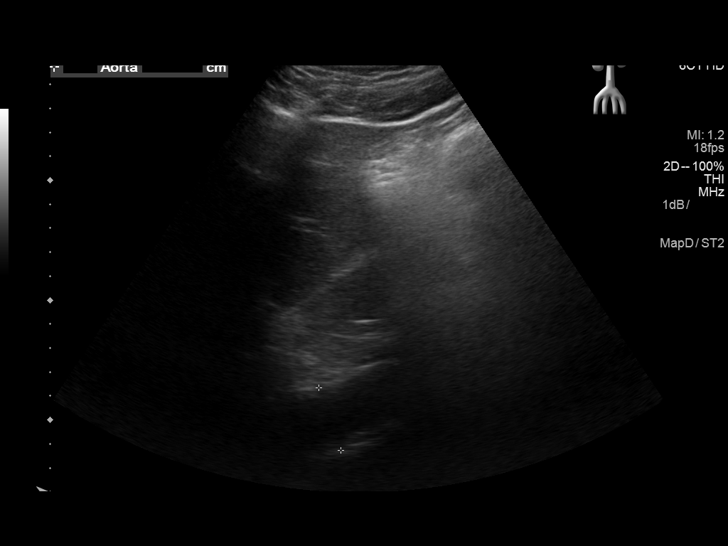
[im 237/259]
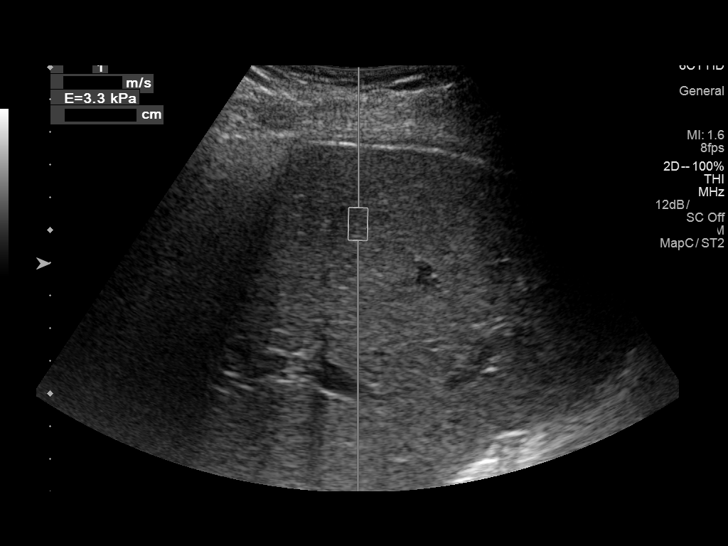
[im 259/259]
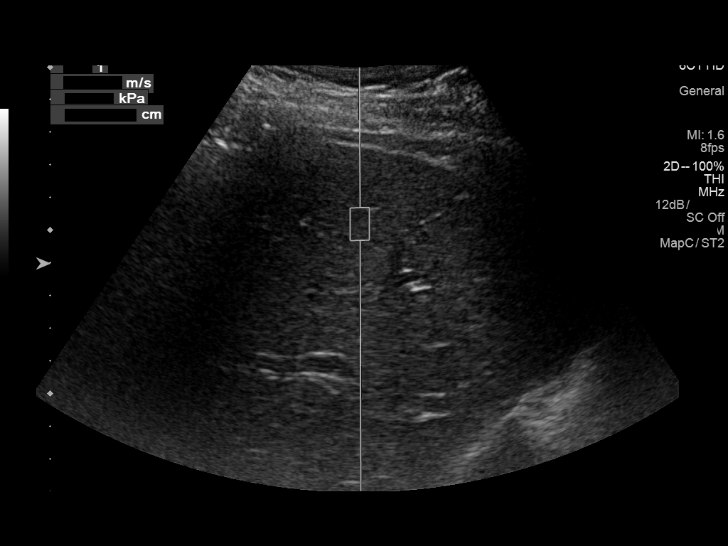

[13 of 25 positions shown; findings below may reference images not displayed]

FINDINGS: ULTRASOUND ABDOMEN

Gallbladder: No gallstones or wall thickening visualized. No
sonographic Murphy sign noted by sonographer.

Common bile duct: Diameter: 6.2 mm

Liver: Heterogeneous in echogenicity.  No focal lesion identified.

IVC: No abnormality visualized.

Pancreas: Visualized portion unremarkable.

Spleen: Size and appearance within normal limits.

Right Kidney: Length: 11.3 cm. Echogenicity within normal limits. No
mass or hydronephrosis visualized.

Left Kidney: Length: 11.4 cm. Echogenicity within normal limits. No
mass or hydronephrosis visualized.

Abdominal aorta: No aneurysm visualized.

Other findings: None.

ULTRASOUND HEPATIC ELASTOGRAPHY

Device: Siemens Helix VTQ

Patient position: Left Lateral Decubitus

Transducer [id number]

Number of measurements: 10

Hepatic segment:  8

Median velocity:   1.37  m/sec

IQR:

IQR/Median velocity ratio:

Corresponding Metavir fibrosis score:  F2 + some F3

Risk of fibrosis: Moderate

Limitations of exam: None

Pertinent findings noted on other imaging exams:  None

Please note that abnormal shear wave velocities may also be
identified in clinical settings other than with hepatic fibrosis,
such as: acute hepatitis, elevated right heart and central venous
pressures including use of beta blockers, Noah disease
([place]), infiltrative processes such as
mastocytosis/amyloidosis/infiltrative tumor, extrahepatic
cholestasis, in the post-prandial state, and liver transplantation.
Correlation with patient history, laboratory data, and clinical
condition recommended.
IMPRESSION: ULTRASOUND ABDOMEN:
Hepatic steatosis.

Mildly dilated common bile duct, 6 mm.

ULTRASOUND HEPATIC ELASTOGRAPHY:

Median hepatic shear wave velocity is calculated at 1.37 m/sec.

Corresponding Metavir fibrosis score is  F2 + some F3.

Risk of fibrosis is Moderate.

Follow-up: Additional testing appropriate

## 2017-05-20 ENCOUNTER — Encounter: Payer: Self-pay | Admitting: Psychology

## 2017-05-20 ENCOUNTER — Ambulatory Visit
Admission: RE | Admit: 2017-05-20 | Discharge: 2017-05-20 | Disposition: A | Discharge status: Home / Self Care | Payer: Medicare Other | Source: Ambulatory Visit | Attending: Nurse Practitioner | Admitting: Nurse Practitioner

## 2017-05-20 ENCOUNTER — Encounter: Payer: Medicare Other | Attending: Psychology | Admitting: Psychology

## 2017-05-20 DIAGNOSIS — K7400 Hepatic fibrosis, unspecified: Secondary | ICD-10-CM

## 2017-05-20 DIAGNOSIS — F0781 Postconcussional syndrome: Secondary | ICD-10-CM | POA: Diagnosis present

## 2017-05-20 DIAGNOSIS — R413 Other amnesia: Secondary | ICD-10-CM | POA: Diagnosis not present

## 2017-05-20 DIAGNOSIS — G44309 Post-traumatic headache, unspecified, not intractable: Secondary | ICD-10-CM | POA: Insufficient documentation

## 2017-05-20 NOTE — Progress Notes (Signed)
The patient returns today and reports that he has experienced a significant deterioration in cognitive functioning and sleep disturbance since I saw him last in September.  The patient reports that as of September he had felt like he was improving some but he feels like that he has made a significant downturn in his memory.  He also describes a worsening with regard to his word finding ability and verbal fluency and also has had times with difficulty recognizing the meaning of words that he is reading.  The patient reports that he has experienced some geographic disorientation as well and feels as if he is doing very poorly.  Patient does describe symptoms consistent with sleep apnea and I have referred him back to his primary care to see about getting up referral for a sleep study.  This may be playing a role in his cognitive worsening.  However, I did go through his care everywhere and looked at the MRI that he had in July 2018.  There were indications of loss of brain mass as well as possible indications of issues and white matter tracts either due to small vessel disease or some type of demyelinating condition.  There may be some type of degenerative condition going on as well.  We set the patient up to come back for a follow-up visit with me in 1 month and we have also set up for 4 hours of neuropsychological testing.

## 2018-04-16 ENCOUNTER — Encounter: Payer: Self-pay | Admitting: Internal Medicine

## 2018-06-09 ENCOUNTER — Other Ambulatory Visit: Payer: Self-pay | Admitting: Nurse Practitioner

## 2018-06-09 DIAGNOSIS — K7400 Hepatic fibrosis, unspecified: Secondary | ICD-10-CM

## 2018-06-15 ENCOUNTER — Ambulatory Visit
Admission: RE | Admit: 2018-06-15 | Discharge: 2018-06-15 | Disposition: A | Discharge status: Home / Self Care | Payer: Medicare Other | Source: Ambulatory Visit | Attending: Nurse Practitioner | Admitting: Nurse Practitioner

## 2018-06-15 DIAGNOSIS — K7400 Hepatic fibrosis, unspecified: Secondary | ICD-10-CM
